# Patient Record
Sex: Female | Born: 1960 | Race: White | Hispanic: No | Smoking: Never smoker
Health system: Southern US, Community
[De-identification: ages and names within clinical notes are randomized; demographics above are authoritative.]

## PROBLEM LIST (undated history)

## (undated) DIAGNOSIS — C50919 Malignant neoplasm of unspecified site of unspecified female breast: Secondary | ICD-10-CM

---

## 2005-09-21 ENCOUNTER — Ambulatory Visit: Payer: Self-pay | Admitting: Oncology

## 2005-09-27 LAB — COMPREHENSIVE METABOLIC PANEL
AST: 25 U/L (ref 0–37)
BUN: 6 mg/dL (ref 6–23)
Calcium: 9.3 mg/dL (ref 8.4–10.5)
Chloride: 100 mEq/L (ref 96–112)
Creatinine, Ser: 0.6 mg/dL (ref 0.40–1.20)
Total Bilirubin: 0.7 mg/dL (ref 0.3–1.2)

## 2005-09-27 LAB — CBC WITH DIFFERENTIAL (CANCER CENTER ONLY)
BASO#: 0 10*3/uL (ref 0.0–0.2)
Eosinophils Absolute: 0.1 10*3/uL (ref 0.0–0.5)
HCT: 35.1 % (ref 34.8–46.6)
HGB: 11.4 g/dL — ABNORMAL LOW (ref 11.6–15.9)
LYMPH#: 1.8 10*3/uL (ref 0.9–3.3)
MCH: 26.5 pg (ref 26.0–34.0)
MONO%: 7 % (ref 0.0–13.0)
NEUT#: 3.7 10*3/uL (ref 1.5–6.5)
RBC: 4.31 10*6/uL (ref 3.70–5.32)

## 2005-09-27 LAB — CANCER ANTIGEN 27.29: CA 27.29: 43 U/mL — ABNORMAL HIGH (ref 0–39)

## 2005-10-04 ENCOUNTER — Encounter: Admission: RE | Admit: 2005-10-04 | Discharge: 2005-10-04 | Payer: Self-pay | Admitting: Oncology

## 2005-10-09 ENCOUNTER — Ambulatory Visit: Payer: Self-pay | Admitting: Surgery

## 2005-10-11 LAB — CBC WITH DIFFERENTIAL (CANCER CENTER ONLY)
BASO%: 0.5 % (ref 0.0–2.0)
LYMPH#: 1.3 10*3/uL (ref 0.9–3.3)
MONO#: 0.3 10*3/uL (ref 0.1–0.9)
NEUT#: 2.3 10*3/uL (ref 1.5–6.5)
Platelets: 325 10*3/uL (ref 145–400)
RDW: 13.7 % (ref 10.5–14.6)
WBC: 4 10*3/uL (ref 3.9–10.0)

## 2005-10-11 LAB — BASIC METABOLIC PANEL - CANCER CENTER ONLY
Calcium: 9.4 mg/dL (ref 8.0–10.3)
Creat: 0.6 mg/dl (ref 0.6–1.2)
Glucose, Bld: 141 mg/dL — ABNORMAL HIGH (ref 73–118)
Sodium: 141 mEq/L (ref 128–145)

## 2005-10-19 LAB — CBC WITH DIFFERENTIAL (CANCER CENTER ONLY)
BASO#: 0 10*3/uL (ref 0.0–0.2)
BASO%: 1.1 % (ref 0.0–2.0)
HCT: 30.7 % — ABNORMAL LOW (ref 34.8–46.6)
HGB: 10.1 g/dL — ABNORMAL LOW (ref 11.6–15.9)
LYMPH#: 1 10*3/uL (ref 0.9–3.3)
MONO#: 0.4 10*3/uL (ref 0.1–0.9)
NEUT%: 12.3 % — ABNORMAL LOW (ref 39.6–80.0)
WBC: 1.6 10*3/uL — ABNORMAL LOW (ref 3.9–10.0)

## 2005-10-25 LAB — CBC WITH DIFFERENTIAL (CANCER CENTER ONLY)
BASO#: 0.1 10*3/uL (ref 0.0–0.2)
EOS%: 1.5 % (ref 0.0–7.0)
Eosinophils Absolute: 0.1 10*3/uL (ref 0.0–0.5)
HCT: 32.7 % — ABNORMAL LOW (ref 34.8–46.6)
HGB: 10.9 g/dL — ABNORMAL LOW (ref 11.6–15.9)
LYMPH#: 2 10*3/uL (ref 0.9–3.3)
MCH: 27.4 pg (ref 26.0–34.0)
MCHC: 33.2 g/dL (ref 32.0–36.0)
NEUT%: 61.9 % (ref 39.6–80.0)
RBC: 3.96 10*6/uL (ref 3.70–5.32)

## 2005-10-25 LAB — BASIC METABOLIC PANEL - CANCER CENTER ONLY
CO2: 25 mEq/L (ref 18–33)
Calcium: 8.9 mg/dL (ref 8.0–10.3)
Creat: 0.8 mg/dl (ref 0.6–1.2)
Sodium: 135 mEq/L (ref 128–145)

## 2005-11-02 LAB — CBC WITH DIFFERENTIAL (CANCER CENTER ONLY)
BASO#: 0 10*3/uL (ref 0.0–0.2)
EOS%: 3.2 % (ref 0.0–7.0)
HGB: 10 g/dL — ABNORMAL LOW (ref 11.6–15.9)
LYMPH%: 55.7 % — ABNORMAL HIGH (ref 14.0–48.0)
MCH: 26.9 pg (ref 26.0–34.0)
MCHC: 33 g/dL (ref 32.0–36.0)
MCV: 82 fL (ref 81–101)
MONO%: 20.5 % — ABNORMAL HIGH (ref 0.0–13.0)
NEUT#: 0.2 10*3/uL — CL (ref 1.5–6.5)
NEUT%: 17.1 % — ABNORMAL LOW (ref 39.6–80.0)

## 2005-11-03 ENCOUNTER — Ambulatory Visit: Payer: Self-pay | Admitting: Oncology

## 2005-11-08 LAB — CBC WITH DIFFERENTIAL (CANCER CENTER ONLY)
EOS%: 1.6 % (ref 0.0–7.0)
LYMPH%: 21.8 % (ref 14.0–48.0)
MCH: 27 pg (ref 26.0–34.0)
MCHC: 32.8 g/dL (ref 32.0–36.0)
MCV: 83 fL (ref 81–101)
MONO%: 11.1 % (ref 0.0–13.0)
NEUT#: 4.3 10*3/uL (ref 1.5–6.5)
Platelets: 233 10*3/uL (ref 145–400)
RDW: 13.6 % (ref 10.5–14.6)

## 2005-11-08 LAB — BASIC METABOLIC PANEL - CANCER CENTER ONLY
BUN, Bld: 8 mg/dL (ref 7–22)
Chloride: 99 mEq/L (ref 98–108)
Potassium: 4 mEq/L (ref 3.3–4.7)
Sodium: 135 mEq/L (ref 128–145)

## 2005-11-13 ENCOUNTER — Ambulatory Visit: Payer: Self-pay | Admitting: Oncology

## 2005-11-16 LAB — CBC WITH DIFFERENTIAL (CANCER CENTER ONLY)
BASO#: 0 10*3/uL (ref 0.0–0.2)
BASO%: 1.5 % (ref 0.0–2.0)
EOS%: 0.5 % (ref 0.0–7.0)
Eosinophils Absolute: 0 10*3/uL (ref 0.0–0.5)
HCT: 28.3 % — ABNORMAL LOW (ref 34.8–46.6)
HGB: 9.4 g/dL — ABNORMAL LOW (ref 11.6–15.9)
LYMPH#: 0.4 10*3/uL — ABNORMAL LOW (ref 0.9–3.3)
LYMPH%: 41.1 % (ref 14.0–48.0)
MCH: 27.1 pg (ref 26.0–34.0)
MCHC: 33.1 g/dL (ref 32.0–36.0)
MCV: 82 fL (ref 81–101)
MONO#: 0.3 10*3/uL (ref 0.1–0.9)
MONO%: 24.3 % — ABNORMAL HIGH (ref 0.0–13.0)
NEUT#: 0.3 10*3/uL — CL (ref 1.5–6.5)
NEUT%: 32.6 % — ABNORMAL LOW (ref 39.6–80.0)
Platelets: 117 10*3/uL — ABNORMAL LOW (ref 145–400)
RBC: 3.46 10*6/uL — ABNORMAL LOW (ref 3.70–5.32)
RDW: 13.7 % (ref 10.5–14.6)
WBC: 1 10*3/uL — ABNORMAL LOW (ref 3.9–10.0)

## 2005-11-16 LAB — BASIC METABOLIC PANEL - CANCER CENTER ONLY
BUN, Bld: 11 mg/dL (ref 7–22)
CO2: 28 mEq/L (ref 18–33)
Chloride: 102 mEq/L (ref 98–108)
Creat: 0.6 mg/dl (ref 0.6–1.2)
Glucose, Bld: 84 mg/dL (ref 73–118)
Potassium: 3.7 mEq/L (ref 3.3–4.7)

## 2005-11-22 LAB — CBC WITH DIFFERENTIAL (CANCER CENTER ONLY)
HCT: 31.2 % — ABNORMAL LOW (ref 34.8–46.6)
MCV: 84 fL (ref 81–101)
Platelets: 260 10*3/uL (ref 145–400)
RBC: 3.71 10*6/uL (ref 3.70–5.32)
RDW: 13.9 % (ref 10.5–14.6)
WBC: 7.7 10*3/uL (ref 3.9–10.0)

## 2005-11-22 LAB — MANUAL DIFFERENTIAL (CHCC SATELLITE)
BASO: 2 % (ref 0–2)
MONO: 13 % (ref 0–13)
PLT EST ~~LOC~~: ADEQUATE
Platelet Morphology: NORMAL
SEG: 58 % (ref 40–75)

## 2005-11-22 LAB — BASIC METABOLIC PANEL - CANCER CENTER ONLY
BUN, Bld: 7 mg/dL (ref 7–22)
CO2: 28 mEq/L (ref 18–33)
Calcium: 9.5 mg/dL (ref 8.0–10.3)
Creat: 0.6 mg/dl (ref 0.6–1.2)
Glucose, Bld: 108 mg/dL (ref 73–118)

## 2005-11-22 LAB — TECHNOLOGIST REVIEW CHCC SATELLITE

## 2005-11-29 LAB — CBC WITH DIFFERENTIAL (CANCER CENTER ONLY)
BASO#: 0 10*3/uL (ref 0.0–0.2)
EOS%: 1.8 % (ref 0.0–7.0)
HGB: 9.5 g/dL — ABNORMAL LOW (ref 11.6–15.9)
LYMPH#: 0.2 10*3/uL — ABNORMAL LOW (ref 0.9–3.3)
MCHC: 33.3 g/dL (ref 32.0–36.0)
MONO#: 0.1 10*3/uL (ref 0.1–0.9)
NEUT#: 0.4 10*3/uL — CL (ref 1.5–6.5)
RBC: 3.44 10*6/uL — ABNORMAL LOW (ref 3.70–5.32)
WBC: 0.7 10*3/uL — CL (ref 3.9–10.0)

## 2005-12-01 ENCOUNTER — Encounter: Admission: RE | Admit: 2005-12-01 | Discharge: 2005-12-01 | Payer: Self-pay | Admitting: Oncology

## 2005-12-14 LAB — BASIC METABOLIC PANEL - CANCER CENTER ONLY
CO2: 30 mEq/L (ref 18–33)
Chloride: 98 mEq/L (ref 98–108)
Glucose, Bld: 126 mg/dL — ABNORMAL HIGH (ref 73–118)
Potassium: 4.3 mEq/L (ref 3.3–4.7)
Sodium: 142 mEq/L (ref 128–145)

## 2005-12-14 LAB — MANUAL DIFFERENTIAL (CHCC SATELLITE)
Band Neutrophils: 22 % — ABNORMAL HIGH (ref 0–10)
LYMPH: 6 % — ABNORMAL LOW (ref 14–48)
Metamyelocytes: 3 % — ABNORMAL HIGH (ref 0–0)
Myelocytes: 7 % — ABNORMAL HIGH (ref 0–0)
PLT EST ~~LOC~~: ADEQUATE
Platelet Morphology: NORMAL

## 2005-12-14 LAB — CBC WITH DIFFERENTIAL (CANCER CENTER ONLY)
MCV: 88 fL (ref 81–101)
Platelets: 240 10*3/uL (ref 145–400)
RBC: 3.94 10*6/uL (ref 3.70–5.32)
WBC: 37.4 10*3/uL — ABNORMAL HIGH (ref 3.9–10.0)

## 2005-12-19 ENCOUNTER — Ambulatory Visit: Payer: Self-pay | Admitting: Oncology

## 2005-12-21 ENCOUNTER — Ambulatory Visit (HOSPITAL_COMMUNITY): Admission: RE | Admit: 2005-12-21 | Discharge: 2005-12-21 | Payer: Self-pay | Admitting: Oncology

## 2005-12-30 ENCOUNTER — Ambulatory Visit (HOSPITAL_COMMUNITY): Admission: RE | Admit: 2005-12-30 | Discharge: 2005-12-30 | Payer: Self-pay | Admitting: Oncology

## 2006-01-03 LAB — CBC WITH DIFFERENTIAL (CANCER CENTER ONLY)
BASO%: 0.3 % (ref 0.0–2.0)
LYMPH%: 17.1 % (ref 14.0–48.0)
MCV: 88 fL (ref 81–101)
MONO#: 0.2 10*3/uL (ref 0.1–0.9)
MONO%: 5.4 % (ref 0.0–13.0)
Platelets: 222 10*3/uL (ref 145–400)
RDW: 20.3 % — ABNORMAL HIGH (ref 10.5–14.6)
WBC: 4.4 10*3/uL (ref 3.9–10.0)

## 2006-01-03 LAB — CMP (CANCER CENTER ONLY)
ALT(SGPT): 25 U/L (ref 10–47)
AST: 33 U/L (ref 11–38)
CO2: 24 mEq/L (ref 18–33)
Calcium: 9.6 mg/dL (ref 8.0–10.3)
Chloride: 100 mEq/L (ref 98–108)
Sodium: 139 mEq/L (ref 128–145)
Total Bilirubin: 0.7 mg/dl (ref 0.20–1.60)
Total Protein: 6.9 g/dL (ref 6.4–8.1)

## 2006-01-10 LAB — BASIC METABOLIC PANEL - CANCER CENTER ONLY
BUN, Bld: 4 mg/dL — ABNORMAL LOW (ref 7–22)
Chloride: 96 mEq/L — ABNORMAL LOW (ref 98–108)
Potassium: 3.4 mEq/L (ref 3.3–4.7)

## 2006-01-10 LAB — CBC WITH DIFFERENTIAL (CANCER CENTER ONLY)
BASO%: 0.6 % (ref 0.0–2.0)
EOS%: 1.4 % (ref 0.0–7.0)
Eosinophils Absolute: 0 10*3/uL (ref 0.0–0.5)
LYMPH%: 26.4 % (ref 14.0–48.0)
MCH: 29.3 pg (ref 26.0–34.0)
MONO%: 30.5 % — ABNORMAL HIGH (ref 0.0–13.0)
NEUT#: 1.2 10*3/uL — ABNORMAL LOW (ref 1.5–6.5)
Platelets: 99 10*3/uL — ABNORMAL LOW (ref 145–400)
RBC: 3.53 10*6/uL — ABNORMAL LOW (ref 3.70–5.32)
RDW: 19.9 % — ABNORMAL HIGH (ref 10.5–14.6)

## 2006-01-12 LAB — CBC WITH DIFFERENTIAL (CANCER CENTER ONLY)
BASO%: 3.6 % — ABNORMAL HIGH (ref 0.0–2.0)
Eosinophils Absolute: 0.1 10*3/uL (ref 0.0–0.5)
LYMPH#: 2.5 10*3/uL (ref 0.9–3.3)
MONO#: 4.1 10*3/uL — ABNORMAL HIGH (ref 0.1–0.9)
NEUT#: 7.5 10*3/uL — ABNORMAL HIGH (ref 1.5–6.5)
Platelets: 150 10*3/uL (ref 145–400)
RBC: 3.89 10*6/uL (ref 3.70–5.32)
RDW: 19.4 % — ABNORMAL HIGH (ref 10.5–14.6)
WBC: 14.8 10*3/uL — ABNORMAL HIGH (ref 3.9–10.0)

## 2006-01-17 LAB — CBC WITH DIFFERENTIAL (CANCER CENTER ONLY)
BASO%: 0.3 % (ref 0.0–2.0)
Eosinophils Absolute: 0.1 10*3/uL (ref 0.0–0.5)
HCT: 33.4 % — ABNORMAL LOW (ref 34.8–46.6)
LYMPH#: 1 10*3/uL (ref 0.9–3.3)
LYMPH%: 11.8 % — ABNORMAL LOW (ref 14.0–48.0)
MCV: 89 fL (ref 81–101)
MONO#: 0.8 10*3/uL (ref 0.1–0.9)
NEUT%: 77.2 % (ref 39.6–80.0)
RBC: 3.76 10*6/uL (ref 3.70–5.32)
RDW: 18.5 % — ABNORMAL HIGH (ref 10.5–14.6)
WBC: 8.2 10*3/uL (ref 3.9–10.0)

## 2006-01-17 LAB — BASIC METABOLIC PANEL - CANCER CENTER ONLY
BUN, Bld: 6 mg/dL — ABNORMAL LOW (ref 7–22)
CO2: 25 mEq/L (ref 18–33)
Glucose, Bld: 126 mg/dL — ABNORMAL HIGH (ref 73–118)
Potassium: 3.2 mEq/L — ABNORMAL LOW (ref 3.3–4.7)

## 2006-01-24 LAB — CBC WITH DIFFERENTIAL (CANCER CENTER ONLY)
EOS%: 1.2 % (ref 0.0–7.0)
Eosinophils Absolute: 0 10*3/uL (ref 0.0–0.5)
HCT: 30.6 % — ABNORMAL LOW (ref 34.8–46.6)
HGB: 10.1 g/dL — ABNORMAL LOW (ref 11.6–15.9)
MCHC: 33 g/dL (ref 32.0–36.0)
MCV: 90 fL (ref 81–101)
MONO%: 27.4 % — ABNORMAL HIGH (ref 0.0–13.0)
RDW: 17 % — ABNORMAL HIGH (ref 10.5–14.6)
WBC: 2.7 10*3/uL — ABNORMAL LOW (ref 3.9–10.0)

## 2006-01-24 LAB — CMP (CANCER CENTER ONLY)
Alkaline Phosphatase: 73 U/L (ref 26–84)
BUN, Bld: 5 mg/dL — ABNORMAL LOW (ref 7–22)
Creat: 0.5 mg/dl — ABNORMAL LOW (ref 0.6–1.2)
Glucose, Bld: 142 mg/dL — ABNORMAL HIGH (ref 73–118)
Sodium: 135 mEq/L (ref 128–145)
Total Bilirubin: 1 mg/dl (ref 0.20–1.60)

## 2006-01-26 ENCOUNTER — Encounter: Admission: RE | Admit: 2006-01-26 | Discharge: 2006-01-26 | Payer: Self-pay | Admitting: Oncology

## 2006-02-02 ENCOUNTER — Ambulatory Visit: Payer: Self-pay | Admitting: Surgery

## 2006-02-07 ENCOUNTER — Ambulatory Visit: Payer: Self-pay | Admitting: Oncology

## 2006-02-07 LAB — CBC WITH DIFFERENTIAL (CANCER CENTER ONLY)
BASO#: 0 10*3/uL (ref 0.0–0.2)
EOS%: 1.1 % (ref 0.0–7.0)
HCT: 33.5 % — ABNORMAL LOW (ref 34.8–46.6)
HGB: 11.1 g/dL — ABNORMAL LOW (ref 11.6–15.9)
MCH: 29.6 pg (ref 26.0–34.0)
MCHC: 33.1 g/dL (ref 32.0–36.0)
MONO%: 22.6 % — ABNORMAL HIGH (ref 0.0–13.0)
NEUT%: 42.4 % (ref 39.6–80.0)

## 2006-02-09 LAB — CBC WITH DIFFERENTIAL (CANCER CENTER ONLY)
BASO#: 0.2 10*3/uL (ref 0.0–0.2)
EOS%: 1.2 % (ref 0.0–7.0)
HGB: 10.2 g/dL — ABNORMAL LOW (ref 11.6–15.9)
LYMPH%: 5.9 % — ABNORMAL LOW (ref 14.0–48.0)
MCH: 29.4 pg (ref 26.0–34.0)
MCHC: 32.4 g/dL (ref 32.0–36.0)
MCV: 91 fL (ref 81–101)
MONO%: 6.7 % (ref 0.0–13.0)
NEUT#: 18.5 10*3/uL — ABNORMAL HIGH (ref 1.5–6.5)
Platelets: 293 10*3/uL (ref 145–400)

## 2006-02-20 ENCOUNTER — Ambulatory Visit: Payer: Self-pay | Admitting: Surgery

## 2006-03-07 LAB — CBC WITH DIFFERENTIAL (CANCER CENTER ONLY)
BASO%: 0.4 % (ref 0.0–2.0)
Eosinophils Absolute: 0.2 10*3/uL (ref 0.0–0.5)
LYMPH%: 33.6 % (ref 14.0–48.0)
MCH: 29.7 pg (ref 26.0–34.0)
MCV: 90 fL (ref 81–101)
MONO%: 12.6 % (ref 0.0–13.0)
Platelets: 379 10*3/uL (ref 145–400)
RDW: 14.1 % (ref 10.5–14.6)

## 2006-03-07 LAB — COMPREHENSIVE METABOLIC PANEL
AST: 18 U/L (ref 0–37)
Albumin: 4.2 g/dL (ref 3.5–5.2)
BUN: 9 mg/dL (ref 6–23)
CO2: 21 mEq/L (ref 19–32)
Calcium: 9.1 mg/dL (ref 8.4–10.5)
Chloride: 102 mEq/L (ref 96–112)
Creatinine, Ser: 0.53 mg/dL (ref 0.40–1.20)
Glucose, Bld: 104 mg/dL — ABNORMAL HIGH (ref 70–99)

## 2006-03-07 LAB — LACTATE DEHYDROGENASE: LDH: 156 U/L (ref 94–250)

## 2006-03-07 LAB — CANCER ANTIGEN 27.29: CA 27.29: 25 U/mL (ref 0–39)

## 2006-03-09 ENCOUNTER — Ambulatory Visit: Payer: Self-pay | Admitting: Radiation Oncology

## 2006-04-06 ENCOUNTER — Ambulatory Visit: Payer: Self-pay | Admitting: Radiation Oncology

## 2006-04-20 ENCOUNTER — Ambulatory Visit: Payer: Self-pay | Admitting: Oncology

## 2006-04-23 LAB — CBC WITH DIFFERENTIAL (CANCER CENTER ONLY)
BASO#: 0 10*3/uL (ref 0.0–0.2)
Eosinophils Absolute: 0.3 10*3/uL (ref 0.0–0.5)
HGB: 12.7 g/dL (ref 11.6–15.9)
LYMPH%: 28.9 % (ref 14.0–48.0)
MCH: 29.7 pg (ref 26.0–34.0)
MCV: 89 fL (ref 81–101)
MONO%: 8.8 % (ref 0.0–13.0)
RBC: 4.27 10*6/uL (ref 3.70–5.32)

## 2006-04-23 LAB — COMPREHENSIVE METABOLIC PANEL
ALT: 17 U/L (ref 0–35)
AST: 18 U/L (ref 0–37)
Albumin: 4.2 g/dL (ref 3.5–5.2)
Alkaline Phosphatase: 107 U/L (ref 39–117)
Potassium: 4.1 mEq/L (ref 3.5–5.3)
Sodium: 142 mEq/L (ref 135–145)
Total Bilirubin: 0.5 mg/dL (ref 0.3–1.2)
Total Protein: 6.5 g/dL (ref 6.0–8.3)

## 2006-05-05 ENCOUNTER — Ambulatory Visit: Payer: Self-pay | Admitting: Radiation Oncology

## 2006-05-17 ENCOUNTER — Ambulatory Visit (HOSPITAL_COMMUNITY): Admission: RE | Admit: 2006-05-17 | Discharge: 2006-05-17 | Payer: Self-pay | Admitting: Oncology

## 2006-05-22 LAB — COMPREHENSIVE METABOLIC PANEL
Alkaline Phosphatase: 113 U/L (ref 39–117)
BUN: 12 mg/dL (ref 6–23)
CO2: 27 mEq/L (ref 19–32)
Glucose, Bld: 119 mg/dL — ABNORMAL HIGH (ref 70–99)
Total Bilirubin: 0.6 mg/dL (ref 0.3–1.2)

## 2006-05-22 LAB — CANCER ANTIGEN 27.29: CA 27.29: 23 U/mL (ref 0–39)

## 2006-05-22 LAB — CBC WITH DIFFERENTIAL (CANCER CENTER ONLY)
BASO%: 0.7 % (ref 0.0–2.0)
EOS%: 5.3 % (ref 0.0–7.0)
LYMPH%: 30.5 % (ref 14.0–48.0)
MCV: 89 fL (ref 81–101)
MONO#: 0.3 10*3/uL (ref 0.1–0.9)
Platelets: 239 10*3/uL (ref 145–400)
RDW: 14.3 % (ref 10.5–14.6)
WBC: 2.6 10*3/uL — ABNORMAL LOW (ref 3.9–10.0)

## 2006-06-04 ENCOUNTER — Ambulatory Visit (HOSPITAL_COMMUNITY): Admission: RE | Admit: 2006-06-04 | Discharge: 2006-06-04 | Payer: Self-pay | Admitting: Oncology

## 2006-06-05 ENCOUNTER — Ambulatory Visit: Payer: Self-pay | Admitting: Radiation Oncology

## 2006-06-18 ENCOUNTER — Ambulatory Visit: Payer: Self-pay | Admitting: Oncology

## 2006-06-19 LAB — COMPREHENSIVE METABOLIC PANEL
ALT: 18 U/L (ref 0–35)
Albumin: 4.4 g/dL (ref 3.5–5.2)
Alkaline Phosphatase: 109 U/L (ref 39–117)
CO2: 23 mEq/L (ref 19–32)
Glucose, Bld: 138 mg/dL — ABNORMAL HIGH (ref 70–99)
Potassium: 3.9 mEq/L (ref 3.5–5.3)
Sodium: 137 mEq/L (ref 135–145)
Total Bilirubin: 0.6 mg/dL (ref 0.3–1.2)
Total Protein: 6.7 g/dL (ref 6.0–8.3)

## 2006-06-19 LAB — CBC WITH DIFFERENTIAL (CANCER CENTER ONLY)
BASO%: 0.4 % (ref 0.0–2.0)
HCT: 39.1 % (ref 34.8–46.6)
LYMPH#: 0.7 10*3/uL — ABNORMAL LOW (ref 0.9–3.3)
MONO#: 0.2 10*3/uL (ref 0.1–0.9)
NEUT#: 2.7 10*3/uL (ref 1.5–6.5)
NEUT%: 72.4 % (ref 39.6–80.0)
Platelets: 268 10*3/uL (ref 145–400)
RDW: 13.9 % (ref 10.5–14.6)
WBC: 3.8 10*3/uL — ABNORMAL LOW (ref 3.9–10.0)

## 2006-06-19 LAB — LACTATE DEHYDROGENASE: LDH: 157 U/L (ref 94–250)

## 2006-07-27 LAB — CBC WITH DIFFERENTIAL (CANCER CENTER ONLY)
BASO#: 0 10*3/uL (ref 0.0–0.2)
Eosinophils Absolute: 0.1 10*3/uL (ref 0.0–0.5)
HCT: 39.4 % (ref 34.8–46.6)
HGB: 13.5 g/dL (ref 11.6–15.9)
LYMPH#: 0.8 10*3/uL — ABNORMAL LOW (ref 0.9–3.3)
MONO#: 0.2 10*3/uL (ref 0.1–0.9)
NEUT#: 1.5 10*3/uL (ref 1.5–6.5)
NEUT%: 55.7 % (ref 39.6–80.0)
RBC: 4.33 10*6/uL (ref 3.70–5.32)
WBC: 2.6 10*3/uL — ABNORMAL LOW (ref 3.9–10.0)

## 2006-07-27 LAB — CMP (CANCER CENTER ONLY)
BUN, Bld: 10 mg/dL (ref 7–22)
CO2: 31 mEq/L (ref 18–33)
Calcium: 9.6 mg/dL (ref 8.0–10.3)
Chloride: 99 mEq/L (ref 98–108)
Creat: 0.9 mg/dl (ref 0.6–1.2)
Glucose, Bld: 108 mg/dL (ref 73–118)

## 2006-07-31 LAB — SEDIMENTATION RATE: Sed Rate: 12 mm/hr (ref 0–22)

## 2006-07-31 LAB — ANA: Anti Nuclear Antibody(ANA): NEGATIVE

## 2006-07-31 LAB — RHEUMATOID FACTOR: Rhuematoid fact SerPl-aCnc: <20 IU/mL (ref 0–20)

## 2006-09-10 ENCOUNTER — Ambulatory Visit: Payer: Self-pay | Admitting: Oncology

## 2006-10-03 LAB — CBC WITH DIFFERENTIAL (CANCER CENTER ONLY)
BASO#: 0 10*3/uL (ref 0.0–0.2)
EOS%: 3.2 % (ref 0.0–7.0)
Eosinophils Absolute: 0.1 10*3/uL (ref 0.0–0.5)
HCT: 36.9 % (ref 34.8–46.6)
HGB: 12.6 g/dL (ref 11.6–15.9)
LYMPH#: 0.9 10*3/uL (ref 0.9–3.3)
MCH: 31 pg (ref 26.0–34.0)
MCHC: 34.2 g/dL (ref 32.0–36.0)
MONO%: 9.1 % (ref 0.0–13.0)
NEUT%: 59.8 % (ref 39.6–80.0)
RBC: 4.07 10*6/uL (ref 3.70–5.32)

## 2006-10-03 LAB — CANCER ANTIGEN 27.29: CA 27.29: 23 U/mL (ref 0–39)

## 2006-10-03 LAB — COMPREHENSIVE METABOLIC PANEL
ALT: 16 U/L (ref 0–35)
AST: 17 U/L (ref 0–37)
Alkaline Phosphatase: 106 U/L (ref 39–117)
Glucose, Bld: 94 mg/dL (ref 70–99)
Sodium: 140 mEq/L (ref 135–145)
Total Bilirubin: 0.3 mg/dL (ref 0.3–1.2)
Total Protein: 6.6 g/dL (ref 6.0–8.3)

## 2006-12-25 ENCOUNTER — Ambulatory Visit: Payer: Self-pay | Admitting: Oncology

## 2007-01-07 ENCOUNTER — Ambulatory Visit: Payer: Self-pay | Admitting: Surgery

## 2007-01-09 ENCOUNTER — Ambulatory Visit: Payer: Self-pay | Admitting: Surgery

## 2007-03-21 ENCOUNTER — Ambulatory Visit: Payer: Self-pay | Admitting: Oncology

## 2007-03-25 LAB — CBC WITH DIFFERENTIAL (CANCER CENTER ONLY)
BASO#: 0 10*3/uL (ref 0.0–0.2)
EOS%: 2.9 % (ref 0.0–7.0)
Eosinophils Absolute: 0.1 10*3/uL (ref 0.0–0.5)
HGB: 13.5 g/dL (ref 11.6–15.9)
LYMPH%: 30.2 % (ref 14.0–48.0)
MCH: 31 pg (ref 26.0–34.0)
MCHC: 33.8 g/dL (ref 32.0–36.0)
MCV: 92 fL (ref 81–101)
MONO%: 8.5 % (ref 0.0–13.0)
NEUT#: 2.2 10*3/uL (ref 1.5–6.5)

## 2007-03-25 LAB — COMPREHENSIVE METABOLIC PANEL
ALT: 17 U/L (ref 0–35)
CO2: 28 mEq/L (ref 19–32)
Calcium: 9.5 mg/dL (ref 8.4–10.5)
Chloride: 100 mEq/L (ref 96–112)
Creatinine, Ser: 0.65 mg/dL (ref 0.40–1.20)

## 2007-03-25 LAB — LACTATE DEHYDROGENASE: LDH: 153 U/L (ref 94–250)

## 2007-03-28 ENCOUNTER — Ambulatory Visit (HOSPITAL_COMMUNITY): Admission: RE | Admit: 2007-03-28 | Discharge: 2007-03-28 | Payer: Self-pay | Admitting: Oncology

## 2007-07-26 ENCOUNTER — Ambulatory Visit (HOSPITAL_COMMUNITY): Admission: RE | Admit: 2007-07-26 | Discharge: 2007-07-26 | Payer: Self-pay | Admitting: Oncology

## 2007-09-20 ENCOUNTER — Ambulatory Visit: Payer: Self-pay | Admitting: Oncology

## 2007-10-04 LAB — COMPREHENSIVE METABOLIC PANEL
AST: 31 U/L (ref 0–37)
Alkaline Phosphatase: 87 U/L (ref 39–117)
BUN: 6 mg/dL (ref 6–23)
Calcium: 9.4 mg/dL (ref 8.4–10.5)
Chloride: 104 mEq/L (ref 96–112)
Creatinine, Ser: 0.57 mg/dL (ref 0.40–1.20)

## 2007-10-04 LAB — CBC WITH DIFFERENTIAL (CANCER CENTER ONLY)
BASO#: 0 10*3/uL (ref 0.0–0.2)
Eosinophils Absolute: 0.1 10*3/uL (ref 0.0–0.5)
HCT: 39.4 % (ref 34.8–46.6)
HGB: 13.4 g/dL (ref 11.6–15.9)
LYMPH#: 1.3 10*3/uL (ref 0.9–3.3)
MONO#: 0.3 10*3/uL (ref 0.1–0.9)
NEUT%: 53.6 % (ref 39.6–80.0)
WBC: 3.7 10*3/uL — ABNORMAL LOW (ref 3.9–10.0)

## 2008-01-06 IMAGING — CT CT ABDOMEN W/ CM
2 of 5 series · 16 of 46 positions shown, 18 images · IV contrast (omnipaque)
Comparison: PET CT of 06/04/06 and bone scan of 05/17/06.

CLINICAL DATA: Right-sided breast cancer.
 CHEST CT WITH CONTRAST:
TECHNIQUE: Multidetector CT imaging of the chest was performed following the standard protocol during bolus administration of intravenous contrast.
 Contrast: 125 cc Omnipaque 300
TECHNIQUE: Multidetector CT imaging of the abdomen was performed following the standard protocol during bolus administration of intravenous contrast.
TECHNIQUE: Multidetector CT imaging of the pelvis was performed following the standard protocol during bolus administration of intravenous contrast.

[Series 2: cap 5.0 b40f · axial · 0.64mm/px · z∈[-608,-58]mm · 13 of 126 slices shown, 15 images]
[im 8/126  soft-tissue]
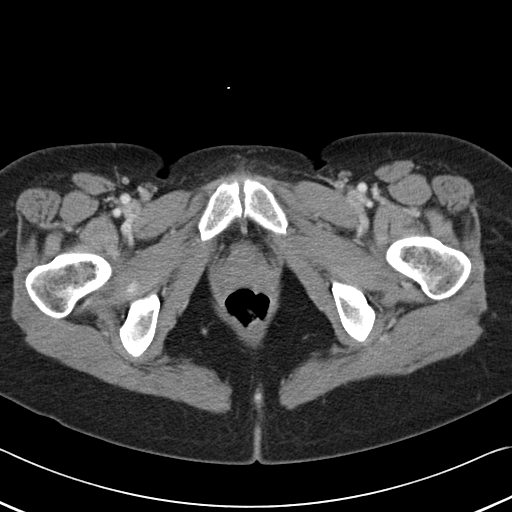
[im 8/126  bone]
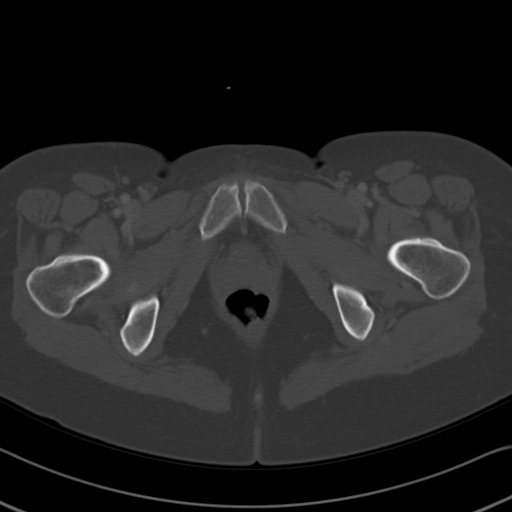
[im 15/126  soft-tissue]
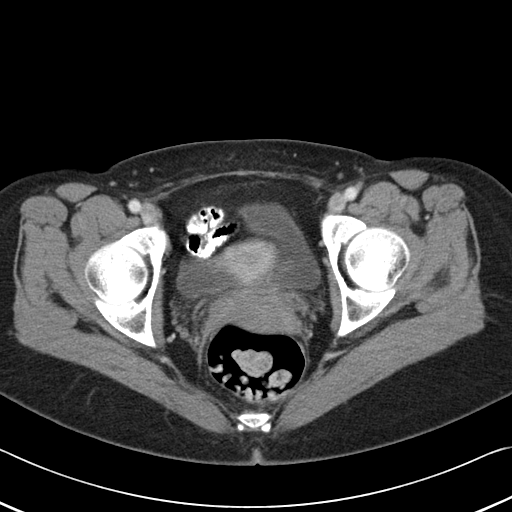
[im 30/126  soft-tissue]
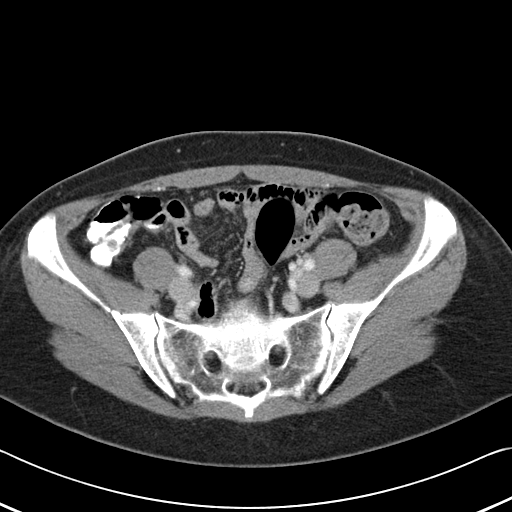
[im 37/126  soft-tissue]
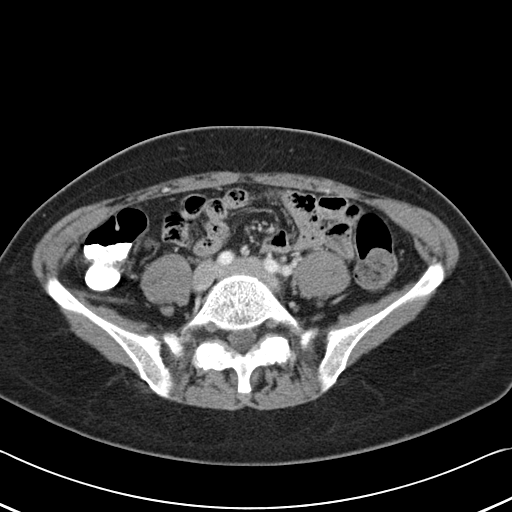
[im 45/126  soft-tissue]
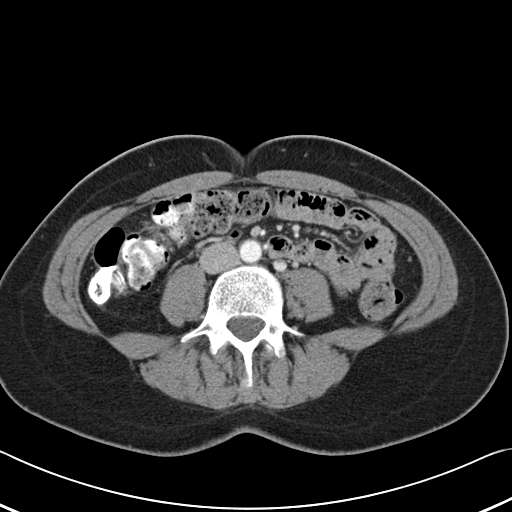
[im 52/126  soft-tissue]
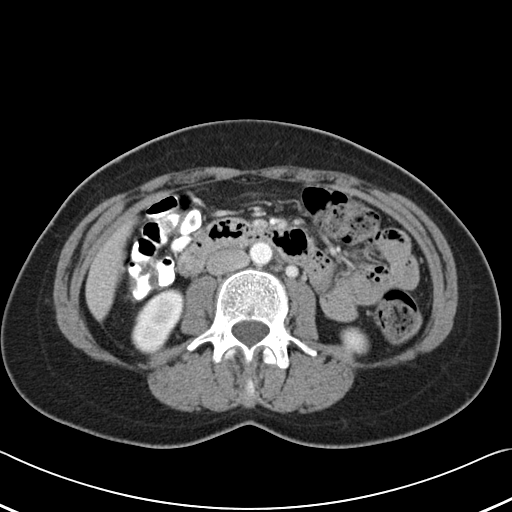
[im 67/126  soft-tissue]
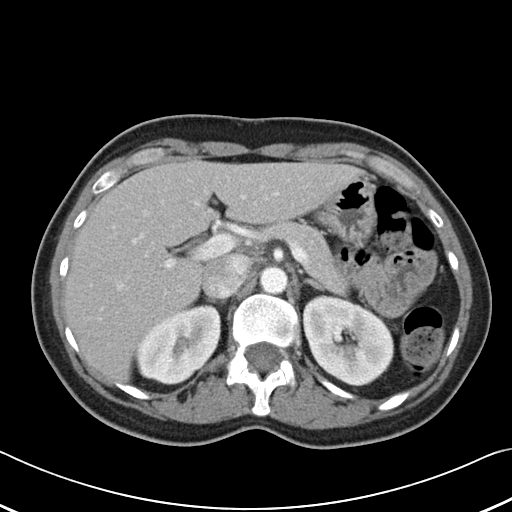
[im 74/126  soft-tissue]
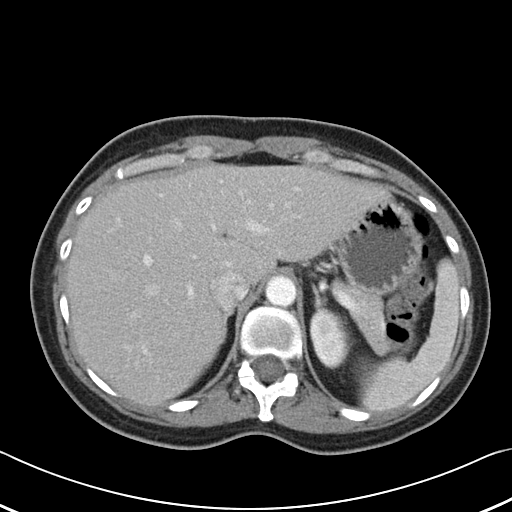
[im 81/126  soft-tissue]
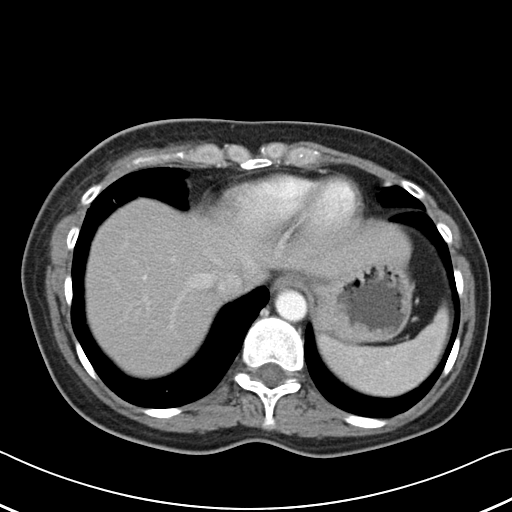
[im 81/126  bone]
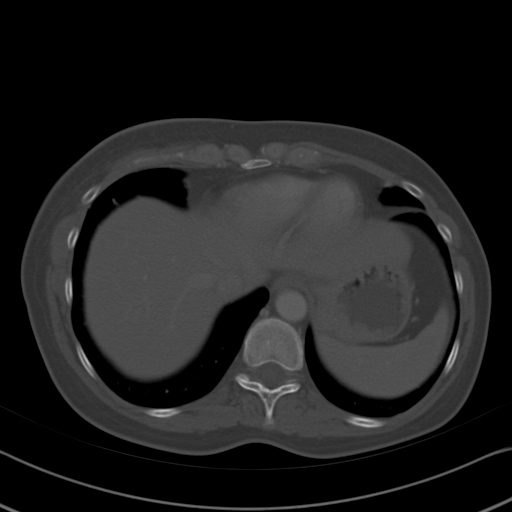
[im 89/126  soft-tissue]
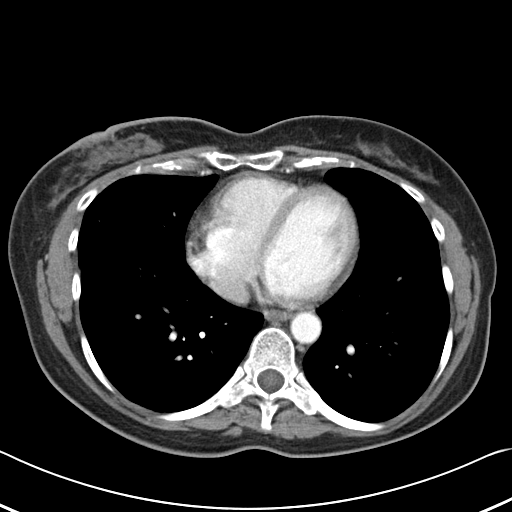
[im 96/126  soft-tissue]
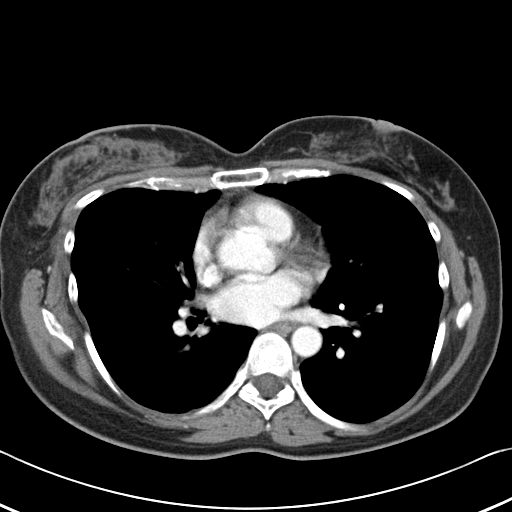
[im 111/126  soft-tissue]
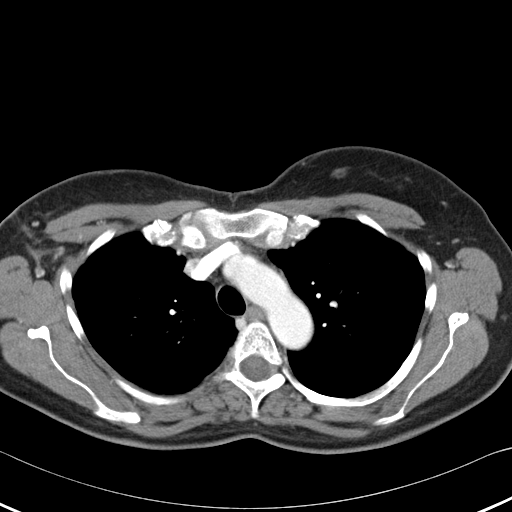
[im 118/126  soft-tissue]
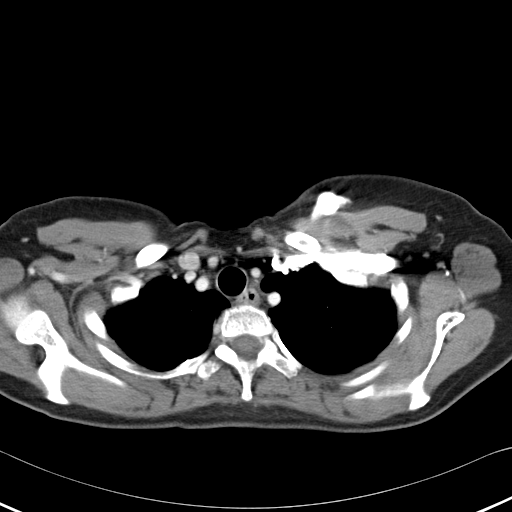

[Series 602: <mpr range> · coronal · 1.23mm/px · 3 of 109 slices shown]
[im 37/109  soft-tissue]
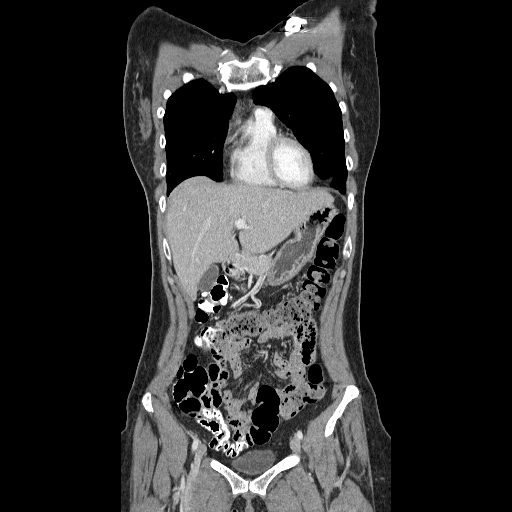
[im 49/109  soft-tissue]
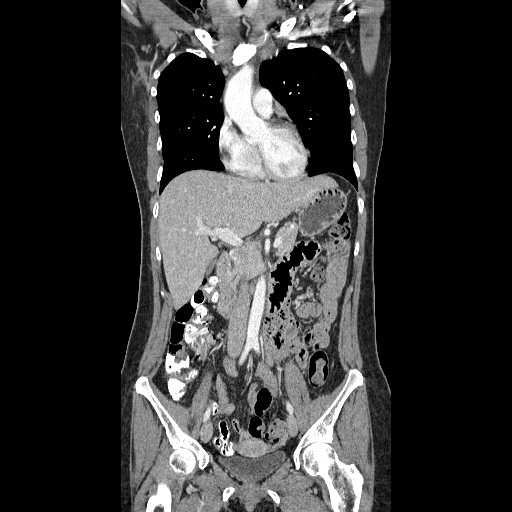
[im 61/109  soft-tissue]
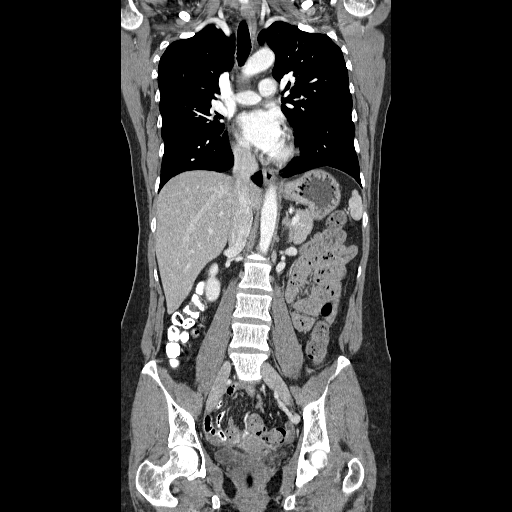

[16 of 46 positions shown; findings below may reference images not displayed]

FINDINGS: Left-sided port-a-cath noted.  No pathologic hilar or mediastinal nodes are evident.  No pathologic axillary or subpectoral adenopathy is noted. Linear opacity in the right middle lobe and in the anterior segment right lower lobe along the right major fissure inferiorly is most compatible with subsegmental atelectasis. No lung mass identified. No findings of recurrent malignancy.
IMPRESSION: 1.  No findings of recurrent malignancy in the chest.
 2.  Linear subsegmental atelectasis is present along the inferior portion of the right major fissure.
 ABDOMEN CT WITH CONTRAST:
FINDINGS: There is mild focal fatty infiltration along the falciform ligament. The liver appears otherwise unremarkable. The gallbladder, pancreas, spleen, adrenal glands, and kidneys appear unremarkable. Upper abdominal bowel appears unremarkable. No pathologic retroperitoneal adenopathy.
IMPRESSION: No metastatic disease to the upper abdomen is identified.
 PELVIS CT WITH CONTRAST:
FINDINGS: A small amount of gas is present in the urinary bladder, likely from recent catheterization.  No pelvic sidewall adenopathy or pelvic mass identified. No inguinal adenopathy noted.  No findings of osseous metastatic disease to the pelvis.
IMPRESSION: No findings of metastatic disease to the pelvis.

## 2008-01-06 IMAGING — CT NM PET TUM IMG SKULL BASE T - THIGH
6 series · 25 of 25 positions shown · IV contrast (350 OM)
Comparison: MRI abdomen from 12/30/05 and bone scan from 05/17/06.

CLINICAL DATA: Breast cancer.
FDG PET-CT TUMOR IMAGING (SKULL BASE TO THIGHS):
TECHNIQUE: 18 mCi F-18 FDG was injected intravenously .Full-ring PET imaging was performed from the skull base through the mid-thighs.  CT data was obtained and used for attenuation correction and anatomic localization only.  (This was not acquired as a diagnostic CT examination).

[Series 1: pet ac · axial · 3.3mm · 4.69mm/px · z∈[-865,+5]mm · 6 of 267 slices shown]
[im 1/267]
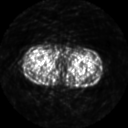
[im 54/267]
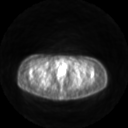
[im 107/267]
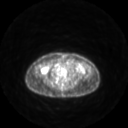
[im 160/267]
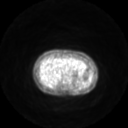
[im 213/267]
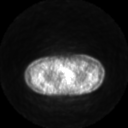
[im 267/267]
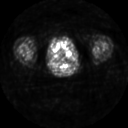

[Series 2: ct images · axial · 3.8mm · 0.98mm/px · z∈[-865,+5]mm · 6 of 266 slices shown]
[im 1/266]
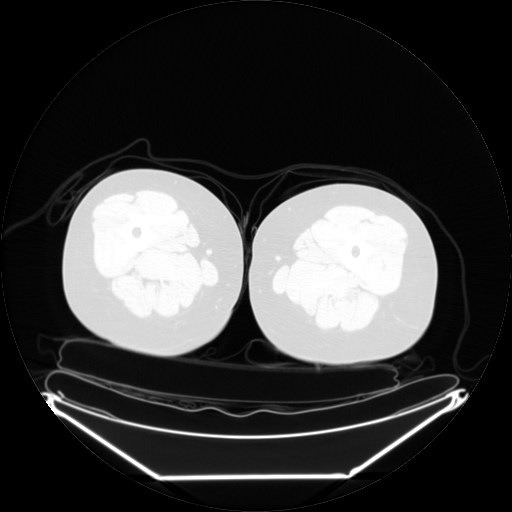
[im 54/266]
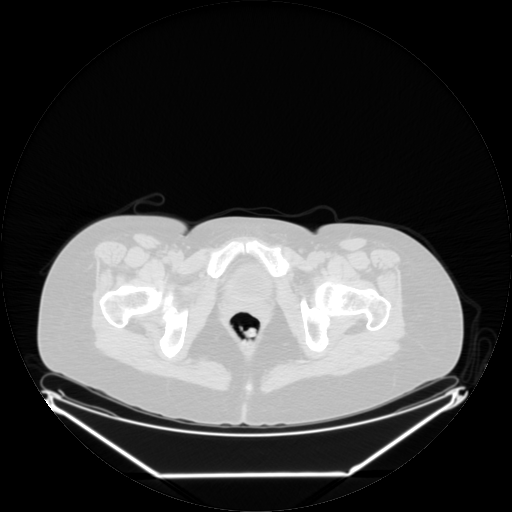
[im 107/266]
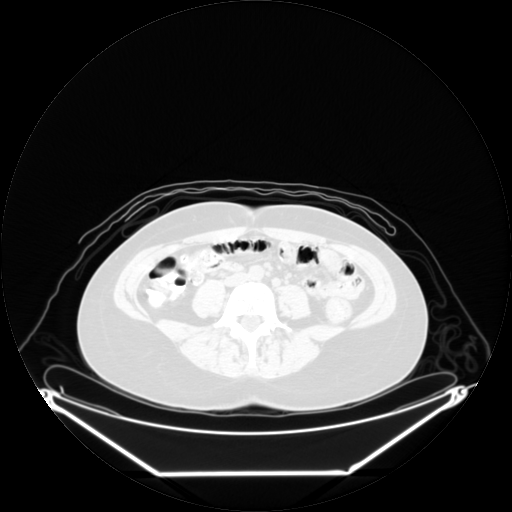
[im 160/266]
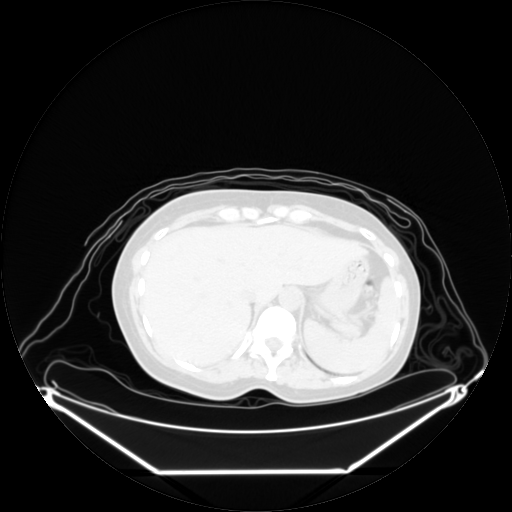
[im 213/266]
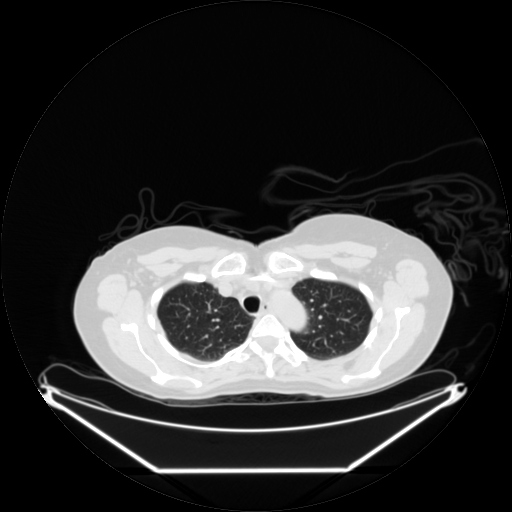
[im 266/266]
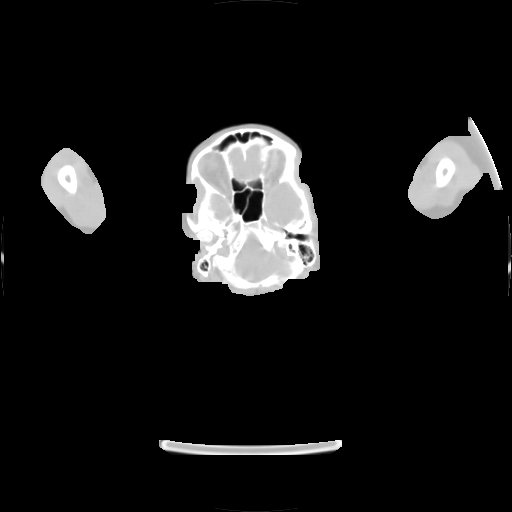

[Series 2: pet nac · axial · 3.3mm · 4.69mm/px · z∈[-865,+5]mm · 6 of 267 slices shown]
[im 1/267]
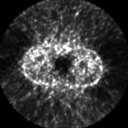
[im 54/267]
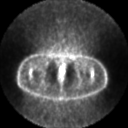
[im 107/267]
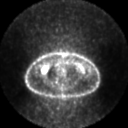
[im 160/267]
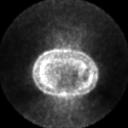
[im 213/267]
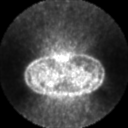
[im 267/267]
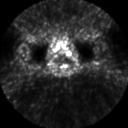

[Series 123: mip · coronal · 3.3mm · 4.69mm/px · 1 of 30 slices shown]
[im 1/30]
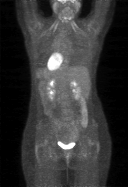

[Series 152: reformatted · axial · 3.3mm · 3.91mm/px · z∈[-727,-28]mm · 5 of 213 slices shown (1 of 2)]
[im 1/213]
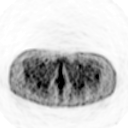
[im 54/213]
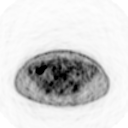
[im 107/213]
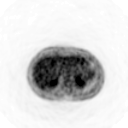
[im 160/213]
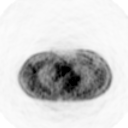
[im 213/213]
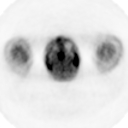

[Series 154: reformatted · coronal · 4.7mm · 6.98mm/px · 1 of 55 slices shown (2 of 2)]
[im 1/55]
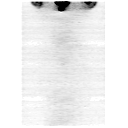

[25 of 25 positions shown; findings below may reference images not displayed]

FINDINGS: Left-sided port-a-cath noted. There is a tiny amount of gas in the urinary bladder. Glottic activity is compatible with phonation during imaging. No significant abnormal activity to suggest malignancy is identified in the neck, chest, abdomen, or pelvis.
IMPRESSION: No findings of metastatic breast cancer.

## 2008-04-06 ENCOUNTER — Ambulatory Visit: Payer: Self-pay | Admitting: Oncology

## 2008-04-08 LAB — CBC WITH DIFFERENTIAL (CANCER CENTER ONLY)
BASO%: 0.8 % (ref 0.0–2.0)
HCT: 37.4 % (ref 34.8–46.6)
LYMPH#: 1.1 10*3/uL (ref 0.9–3.3)
MONO#: 0.3 10*3/uL (ref 0.1–0.9)
NEUT%: 58.8 % (ref 39.6–80.0)
Platelets: 264 10*3/uL (ref 145–400)
RDW: 11.7 % (ref 10.5–14.6)
WBC: 3.7 10*3/uL — ABNORMAL LOW (ref 3.9–10.0)

## 2008-04-08 LAB — CMP (CANCER CENTER ONLY)
ALT(SGPT): 21 U/L (ref 10–47)
AST: 26 U/L (ref 11–38)
Albumin: 3.9 g/dL (ref 3.3–5.5)
Calcium: 11.8 mg/dL — ABNORMAL HIGH (ref 8.0–10.3)
Chloride: 102 mEq/L (ref 98–108)
Potassium: 4.3 mEq/L (ref 3.3–4.7)
Sodium: 142 mEq/L (ref 128–145)

## 2008-06-29 ENCOUNTER — Ambulatory Visit: Payer: Self-pay | Admitting: Internal Medicine

## 2008-07-09 ENCOUNTER — Ambulatory Visit: Payer: Self-pay | Admitting: Surgery

## 2008-09-02 ENCOUNTER — Ambulatory Visit: Payer: Self-pay | Admitting: Oncology

## 2008-09-08 LAB — CBC WITH DIFFERENTIAL (CANCER CENTER ONLY)
BASO%: 0.9 % (ref 0.0–2.0)
LYMPH#: 1.3 10*3/uL (ref 0.9–3.3)
LYMPH%: 33.1 % (ref 14.0–48.0)
MCV: 88 fL (ref 81–101)
MONO#: 0.3 10*3/uL (ref 0.1–0.9)
NEUT#: 2 10*3/uL (ref 1.5–6.5)
Platelets: 307 10*3/uL (ref 145–400)
RDW: 11.7 % (ref 10.5–14.6)
WBC: 3.8 10*3/uL — ABNORMAL LOW (ref 3.9–10.0)

## 2008-09-08 LAB — CMP (CANCER CENTER ONLY)
Alkaline Phosphatase: 73 U/L (ref 26–84)
BUN, Bld: 11 mg/dL (ref 7–22)
Glucose, Bld: 114 mg/dL (ref 73–118)
Total Bilirubin: 0.6 mg/dl (ref 0.20–1.60)

## 2008-09-09 LAB — CANCER ANTIGEN 27.29: CA 27.29: 24 U/mL (ref 0–39)

## 2008-09-09 LAB — VITAMIN D 25 HYDROXY (VIT D DEFICIENCY, FRACTURES): Vit D, 25-Hydroxy: 49 ng/mL (ref 30–89)

## 2009-01-01 ENCOUNTER — Ambulatory Visit: Payer: Self-pay | Admitting: Oncology

## 2009-02-02 ENCOUNTER — Ambulatory Visit: Payer: Self-pay | Admitting: Oncology

## 2009-06-24 ENCOUNTER — Ambulatory Visit: Payer: Self-pay | Admitting: Oncology

## 2009-12-06 ENCOUNTER — Ambulatory Visit: Payer: Self-pay | Admitting: Oncology

## 2009-12-30 ENCOUNTER — Ambulatory Visit: Payer: Self-pay | Admitting: Oncology

## 2010-03-26 ENCOUNTER — Encounter: Payer: Self-pay | Admitting: Oncology

## 2010-03-27 ENCOUNTER — Encounter: Payer: Self-pay | Admitting: Oncology

## 2010-03-27 ENCOUNTER — Encounter: Payer: Self-pay | Admitting: Obstetrics & Gynecology

## 2010-03-28 ENCOUNTER — Encounter: Payer: Self-pay | Admitting: Oncology

## 2012-08-13 ENCOUNTER — Ambulatory Visit: Payer: Self-pay | Admitting: Family Medicine

## 2016-02-15 ENCOUNTER — Other Ambulatory Visit: Payer: Self-pay | Admitting: Family Medicine

## 2016-12-10 ENCOUNTER — Emergency Department
Admission: EM | Admit: 2016-12-10 | Discharge: 2016-12-10 | Disposition: A | Discharge status: Home / Self Care | Payer: 59 | Attending: Emergency Medicine | Admitting: Emergency Medicine

## 2016-12-10 ENCOUNTER — Encounter: Payer: Self-pay | Admitting: Emergency Medicine

## 2016-12-10 DIAGNOSIS — W293XXA Contact with powered garden and outdoor hand tools and machinery, initial encounter: Secondary | ICD-10-CM | POA: Insufficient documentation

## 2016-12-10 DIAGNOSIS — Y999 Unspecified external cause status: Secondary | ICD-10-CM | POA: Insufficient documentation

## 2016-12-10 DIAGNOSIS — Z23 Encounter for immunization: Secondary | ICD-10-CM | POA: Diagnosis not present

## 2016-12-10 DIAGNOSIS — Z853 Personal history of malignant neoplasm of breast: Secondary | ICD-10-CM | POA: Insufficient documentation

## 2016-12-10 DIAGNOSIS — S6992XA Unspecified injury of left wrist, hand and finger(s), initial encounter: Secondary | ICD-10-CM | POA: Diagnosis present

## 2016-12-10 DIAGNOSIS — Z79899 Other long term (current) drug therapy: Secondary | ICD-10-CM | POA: Diagnosis not present

## 2016-12-10 DIAGNOSIS — Y929 Unspecified place or not applicable: Secondary | ICD-10-CM | POA: Insufficient documentation

## 2016-12-10 DIAGNOSIS — Y93H2 Activity, gardening and landscaping: Secondary | ICD-10-CM | POA: Insufficient documentation

## 2016-12-10 DIAGNOSIS — S61215A Laceration without foreign body of left ring finger without damage to nail, initial encounter: Secondary | ICD-10-CM | POA: Diagnosis not present

## 2016-12-10 HISTORY — DX: Malignant neoplasm of unspecified site of unspecified female breast: C50.919

## 2016-12-10 MED ORDER — TETANUS-DIPHTH-ACELL PERTUSSIS 5-2.5-18.5 LF-MCG/0.5 IM SUSP
0.5000 mL | Freq: Once | INTRAMUSCULAR | Status: AC
  Administered 2016-12-10: 0.5 mL via INTRAMUSCULAR
  Filled 2016-12-10: qty 0.5

## 2016-12-10 MED ORDER — LIDOCAINE HCL (PF) 1 % IJ SOLN
5.0000 mL | Freq: Once | INTRAMUSCULAR | Status: AC
  Administered 2016-12-10: 5 mL
  Filled 2016-12-10: qty 5

## 2016-12-10 NOTE — ED Provider Notes (Signed)
[facility] Emergency Department Provider Note ____________________________________________  Time seen: 1401  I have reviewed the triage vital signs and the nursing notes.  HISTORY  Chief Complaint  Finger Injury  HPI Kelly Braun is a 56 y.o. female presents to the ED for evaluation of a laceration to the left ring finger. She was trimming the hedges with a hedge trimmer, when she accidentally cut her ring finger at the tip. She is unclear of her tetanus status. Bleeding is controlled. No other injury is reported.   Past Medical History:  Diagnosis Date  . Breast cancer ([place])    There are no active problems to display for this patient.  History reviewed. No pertinent surgical history.  Prior to Admission medications   Medication Sig Start Date End Date Taking? Authorizing Provider  FLUoxetine (PROZAC) 40 MG capsule Take 40 mg by mouth daily.   Yes [provider]    Allergies Patient has no known allergies.  No family history on file.  Social History Social History  Substance Use Topics  . Smoking status: Never Smoker  . Smokeless tobacco: Never Used  . Alcohol use Yes     Comment: occas   Review of Systems  Constitutional: Negative for fever. Cardiovascular: Negative for chest pain. Respiratory: Negative for shortness of breath. Gastrointestinal: Negative for abdominal pain, vomiting and diarrhea. Musculoskeletal: Negative for back pain. Skin: Negative for rash. Finger lac as above Neurological: Negative for headaches, focal weakness or numbness. ____________________________________________  PHYSICAL EXAM:  VITAL SIGNS: ED Triage Vitals [12/10/16 1312]  Enc Vitals Group     BP (!) 141/91     Pulse Rate 92     Resp 16     Temp 98.8 F (37.1 C)     Temp Source Oral     SpO2 97 %     Weight 165 lb (74.8 kg)     Height 5\' 7"  (1.702 m)     Head Circumference      Peak Flow      Pain Score 0     Pain Loc      Pain Edu?       Excl. in [place]?     Constitutional: Alert and oriented. Well appearing and in no distress. Head: Normocephalic and atraumatic. Eyes: Conjunctivae are normal. Normal extraocular movements Cardiovascular: Normal distal pulses. Respiratory: Normal respiratory effort.  Musculoskeletal: Normal composite fist. Left ring finger with a flap laceration to the ulnar aspect of the ring finger DIP. Nontender with normal range of motion in all extremities.  Neurologic:  Normal gross sensation. Normal speech and language. No gross focal neurologic deficits are appreciated. Skin:  Skin is warm, dry and intact. No rash noted. ____________________________________________  PROCEDURES  Tdap 0.5 ml IM  LACERATION REPAIR Performed by: Melvenia Needles Authorized by: Melvenia Needles Consent: Verbal consent obtained. Risks and benefits: risks, benefits and alternatives were discussed Consent given by: patient Patient identity confirmed: provided demographic data Prepped and Draped in normal sterile fashion Wound explored  Laceration Location: left ring finger  Laceration Length: 1.5 cm  No Foreign Bodies seen or palpated  Anesthesia: transthecal infiltration  Local anesthetic: lidocaine 1% w/o epinephrine  Anesthetic total: 3 ml  Irrigation method: syringe Amount of cleaning: standard  Skin closure: 5-0 nylon  Number of sutures: 4  Technique: interrupted  Patient tolerance: Patient tolerated the procedure well with no immediate complications. ____________________________________________  INITIAL IMPRESSION / ASSESSMENT AND PLAN / ED COURSE  Patient  ED evaluation of an accidental laceration to the left fourth finger, while using an electric tremors. The wound is a probably prepped and draped and wound repair is provided using sutures. Wound care structures are provided to the patient and she will see her primary care provider or local community clinic for suture removal  in 7-10 days. ____________________________________________  FINAL CLINICAL IMPRESSION(S) / ED DIAGNOSES  Final diagnoses:  Laceration of left ring finger without foreign body without damage to nail, initial encounter      Melvenia Needles, PA-C 12/10/16 1446    Lisa Roca, MD 12/10/16 1530

## 2016-12-10 NOTE — ED Triage Notes (Signed)
Pt to ED with c/o lac to LFT fourth finger from electric trimmers while working in yard today. Bleeding controlled.

## 2016-12-10 NOTE — ED Notes (Signed)
See triage note  States she was using a trimmer in yard today slipped  Laceration noted to left 4th finger near nail

## 2016-12-10 NOTE — Discharge Instructions (Signed)
Keep the wound clean, dry, and covered. Wash with soap & water as needed. See your provider or [facility] for suture removal in 10 days.

## 2017-10-18 ENCOUNTER — Encounter: Payer: Self-pay | Admitting: Genetics

## 2024-03-17 ENCOUNTER — Ambulatory Visit

## 2024-03-17 DIAGNOSIS — L820 Inflamed seborrheic keratosis: Secondary | ICD-10-CM | POA: Diagnosis not present

## 2024-03-17 DIAGNOSIS — L918 Other hypertrophic disorders of the skin: Secondary | ICD-10-CM

## 2024-03-17 NOTE — Patient Instructions (Addendum)

## 2024-03-17 NOTE — Progress Notes (Signed)
" °  °  Subjective   Kelly Braun is a 64 y.o. female who presents for the following: Lesion(s) of concern . Patient is new patient  Today patient reports: LOC on the neck and right temple   Review of Systems:    No other skin or systemic complaints except as noted in HPI or Assessment and Plan.  The following portions of the chart were reviewed this encounter and updated as appropriate: medications, allergies, medical history  Relevant Medical History:  n/a   Objective  (SKPE) Well appearing patient in no apparent distress; mood and affect are within normal limits. 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 - Explained that more lesions may appear over time - Patient elected to treat these lesions w/ cosmetic removal - PAID OUT OF POCKET   Seborrheic keratosis, inflamed - Discussed diagnosis, typical course, and treatment options for this condition - Reassurance, benign, monitor - ABCDE's discussed - Given irritation and symptoms, will proceed with cryotherapy as below  Was sun protection counseling provided?: Yes   Level of service outlined above   Patient instructions (SKPI)   Procedures, orders, diagnosis for this visit:  INFLAMED SEBORRHEIC KERATOSIS Right temple Symptomatic, irritating, patient would like treated. - Destruction of lesion - Right temple Complexity: simple   Destruction method:  cryotherapy   Informed consent: discussed and consent obtained   Timeout:  patient name, date of birth, surgical site, and procedure verified Lesion destroyed using liquid nitrogen: Yes   Region frozen until ice ball extended beyond lesion: Yes   Cryo cycles: 1 or 2. Outcome: patient tolerated procedure well with no complications   Post-procedure details: wound care instructions given    ACROCHORDON Left and right lateral neck - Destruction of lesion - Left and right lateral neck Complexity: simple   Destruction method comment:  Snip removal Informed consent: discussed and consent obtained   Timeout:  patient name, date of birth, surgical site, and procedure verified Hemostasis achieved with:  pressure and aluminum chloride Outcome: patient tolerated procedure well with no complications   Additional details:  11 lesion removed    Inflamed seborrheic keratosis -     Destruction of lesion  Acrochordon -     Destruction of lesion    Return to clinic: Return if symptoms worsen or fail to improve.  I, Emerick Ege, CMA am acting as scribe for Lauraine JAYSON Kanaris, MD.   Documentation: I have reviewed the above documentation for accuracy and completeness, and I agree with the above.  Lauraine JAYSON Kanaris, MD  "
